# Patient Record
Sex: Female | Born: 1995 | Race: White | Hispanic: No | Marital: Single | Smoking: Never smoker
Health system: Southern US, Community
[De-identification: ages and names within clinical notes are randomized; demographics above are authoritative.]

## PROBLEM LIST (undated history)

## (undated) DIAGNOSIS — D649 Anemia, unspecified: Secondary | ICD-10-CM

## (undated) DIAGNOSIS — S0990XA Unspecified injury of head, initial encounter: Secondary | ICD-10-CM

## (undated) HISTORY — DX: Unspecified injury of head, initial encounter: S09.90XA

## (undated) HISTORY — DX: Anemia, unspecified: D64.9

---
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
## 2016-05-07 ENCOUNTER — Emergency Department
Admission: EM | Admit: 2016-05-07 | Discharge: 2016-05-07 | Disposition: A | Discharge status: Home / Self Care | Payer: Managed Care, Other (non HMO) | Attending: Emergency Medicine | Admitting: Emergency Medicine

## 2016-05-07 ENCOUNTER — Emergency Department: Payer: Managed Care, Other (non HMO)

## 2016-05-07 ENCOUNTER — Encounter: Payer: Self-pay | Admitting: Emergency Medicine

## 2016-05-07 DIAGNOSIS — Y9389 Activity, other specified: Secondary | ICD-10-CM | POA: Insufficient documentation

## 2016-05-07 DIAGNOSIS — S92302A Fracture of unspecified metatarsal bone(s), left foot, initial encounter for closed fracture: Secondary | ICD-10-CM | POA: Diagnosis not present

## 2016-05-07 DIAGNOSIS — S93402A Sprain of unspecified ligament of left ankle, initial encounter: Secondary | ICD-10-CM | POA: Diagnosis not present

## 2016-05-07 DIAGNOSIS — Y92410 Unspecified street and highway as the place of occurrence of the external cause: Secondary | ICD-10-CM | POA: Insufficient documentation

## 2016-05-07 DIAGNOSIS — X501XXA Overexertion from prolonged static or awkward postures, initial encounter: Secondary | ICD-10-CM | POA: Diagnosis not present

## 2016-05-07 DIAGNOSIS — Y999 Unspecified external cause status: Secondary | ICD-10-CM | POA: Diagnosis not present

## 2016-05-07 DIAGNOSIS — S93602A Unspecified sprain of left foot, initial encounter: Secondary | ICD-10-CM

## 2016-05-07 DIAGNOSIS — S99922A Unspecified injury of left foot, initial encounter: Secondary | ICD-10-CM | POA: Diagnosis present

## 2016-05-07 NOTE — ED Triage Notes (Addendum)
Patient ambulatory to triage with steady gait, without difficulty or distress noted; pt reports on Thursday injured left foot/ankle while crossing street

## 2016-05-07 NOTE — ED Notes (Signed)

## 2016-05-07 NOTE — Discharge Instructions (Signed)
Use the crutches to ambulate without bear weight through the left foot. Keep the splint clean and dry. Rest of the foot elevated and ice the dorsal foot through the splint. Take over-the-counter antipyretics as needed for pain relief. Follow-up with Dr. Rosita KeaMenz or local orthopedic provider in [place] for further fracture management.

## 2016-05-07 NOTE — ED Notes (Signed)
Pt presents to ED with c/o LEFT ankle pain after an injury last Thursday. Pt states she was walking across the street when her ankle "just gave out on me". Pt reports LEFT ankle pain, as well as pain at the base of her big toe. Pt ambulatory to t/x room with difficulty or distress noted. CMS in affected extremity intact, cap refill <3 secs with skin WP&D.

## 2016-05-07 NOTE — ED Provider Notes (Signed)
[facility] Emergency Department Provider Note ____________________________________________  Time seen: 2205  I have reviewed the triage vital signs and the nursing notes.  HISTORY  Chief Complaint  Ankle Pain  HPI Carla Osborne is a 20 y.o. female presents to the ED for evaluation of injury to her left foot and ankle. She describes Thursday crossing the street wearing flat shoes when she inadvertently rolled her left ankle. She describes the foot "just gave out on me." She describes pain to the lateral aspect of the ankle but describes swelling, mild bruising, and point tenderness to the dorsal aspect of the foot she localizes pain both to the base of the big toe as well as to the dorsal mid foot over the metatarsals. She describes pain is increased with attempts to bear weight and walk on the left foot. She denies any other injury at this time. She does give the history of a remote ankle sprain in April that [facility]   No past medical history on file.  There are no active problems to display for this patient.  History reviewed. No pertinent surgical history.  Prior to Admission medications   Not on File    Allergies Review of patient's allergies indicates no known allergies.  No family history on file.  Social History Social History  Substance Use Topics  . Smoking status: Never Smoker  . Smokeless tobacco: Never Used  . Alcohol use No   Review of Systems  Constitutional: Negative for fever. Musculoskeletal: Negative for back pain. Left foot and ankle pain as above. Skin: Negative for rash. Neurological: Negative for headaches, focal weakness or numbness. ____________________________________________  PHYSICAL EXAM:  VITAL SIGNS: ED Triage Vitals [05/07/16 2110]  Enc Vitals Group     BP (!) 143/84     Pulse Rate 95     Resp 18     Temp 97.9 F (36.6 C)     Temp Source Oral     SpO2 99 %    Weight 145 lb (65.8 kg)     Height 5\' 3"  (1.6 m)     Head Circumference      Peak Flow      Pain Score 7     Pain Loc      Pain Edu?      Excl. in GC?    Constitutional: Alert and oriented. Well appearing and in no distress. Head: Normocephalic and atraumatic. Cardiovascular: Normal distal pulses. Respiratory: Normal respiratory effort.  Musculoskeletal: left foot and ankle without any obvious deformity, effusion, swelling, or dislocation. The patient is sent to palpation over the dorsal aspect of the midfoot. She is also mildly tender lesion over the dorsal base of the first metatarsal. Minimal tender palpation to the lateral aspect of the ankle. Negative drawer sign. No Acute tenderness is noted. Nontender with normal range of motion in all extremities.  Neurologic: Normal speech and language. No gross focal neurologic deficits are appreciated. Skin:  Skin is warm, dry and intact. No rash noted. ____________________________________________   RADIOLOGY  Left Ankle IMPRESSION: Bony density at the base of the metatarsal seen only on the lateral projection. Correlation to point tenderness is recommended. No other focal abnormality is seen.  Left Foot IMPRESSION: 1. No acute fracture or dislocation is identified. 2. Bony density at the dorsal margin of first and second metatarsal proximal interspace. If not focally tender this is likely an os intermetatarseum.  I, Razi Hickle, Charlesetta Ivory, personally  viewed and evaluated these images (plain radiographs) as part of my medical decision making, as well as reviewing the written report by the radiologist. ____________________________________________  PROCEDURES  Short leg OCL Crutches ____________________________________________  INITIAL IMPRESSION / ASSESSMENT AND PLAN / ED COURSE  Patient with a clinically significant radiologic finding on ankle film. She is point tender over the abnormal bony projection. The concern is for an  avulsion fracture to the metatarsal. She will be fitted with a posterior short leg splint and provided with crutches for nonweightbearing until she is evaluated by a podiatrist or orthopedic provider back in her home town.  Clinical Course   ____________________________________________  FINAL CLINICAL IMPRESSION(S) / ED DIAGNOSES  Final diagnoses:  Sprain of left ankle, unspecified ligament, initial encounter  Sprain of left foot, initial encounter  Closed nondisplaced fracture of metatarsal bone of left foot, unspecified metatarsal, initial encounter      Lissa HoardJenise V Bacon Alyza Artiaga, PA-C 05/07/16 [id number]    Phineas SemenGraydon Goodman, MD 05/09/16 [id number]

## 2016-07-28 DIAGNOSIS — S0990XA Unspecified injury of head, initial encounter: Secondary | ICD-10-CM

## 2016-07-28 HISTORY — DX: Unspecified injury of head, initial encounter: S09.90XA

## 2017-04-19 ENCOUNTER — Encounter: Payer: Self-pay | Admitting: Emergency Medicine

## 2017-04-19 ENCOUNTER — Emergency Department: Payer: 59

## 2017-04-19 ENCOUNTER — Emergency Department
Admission: EM | Admit: 2017-04-19 | Discharge: 2017-04-19 | Disposition: A | Discharge status: Home / Self Care | Payer: 59 | Attending: Emergency Medicine | Admitting: Emergency Medicine

## 2017-04-19 DIAGNOSIS — M79671 Pain in right foot: Secondary | ICD-10-CM | POA: Diagnosis present

## 2017-04-19 DIAGNOSIS — M7751 Other enthesopathy of right foot: Secondary | ICD-10-CM

## 2017-04-19 DIAGNOSIS — M65271 Calcific tendinitis, right ankle and foot: Secondary | ICD-10-CM | POA: Diagnosis not present

## 2017-04-19 MED ORDER — MELOXICAM 15 MG PO TABS: 15.0000 mg | ORAL_TABLET | Freq: Every day | ORAL | 0 refills | Status: DC

## 2017-04-19 NOTE — ED Triage Notes (Addendum)
Pt c/o right foot pain/swelling for a week almost. Worse when ambulating and driving.  Ambulated to triage with steady gait. NAD. VSS. No known injury

## 2017-04-19 NOTE — ED Notes (Signed)
Pt c/o swelling to right foot for the past week. Pt states she has not taken any medications to relieve the pain.  Denies any injury, states she has sprained both feet in the past.  Pain is 7/10.

## 2017-04-19 NOTE — ED Provider Notes (Signed)
[facility] Emergency Department Provider Note  ____________________________________________  Time seen: Approximately 3:53 PM  I have reviewed the triage vital signs and the nursing notes.   HISTORY  Chief Complaint Foot Pain    HPI Carla Osborne is a 21 y.o. female who presents emergency department complaining of right foot pain. Patient reports that she has had intermittent pain 1 week. No injury. Patient reports subjective swelling. No medications prior to arrival. Patient reports the pain is on the dorsal aspect of the right foot. No other injury or complaint.   History reviewed. No pertinent past medical history.  There are no active problems to display for this patient.   History reviewed. No pertinent surgical history.  Prior to Admission medications   Medication Sig Start Date End Date Taking? Authorizing Provider  meloxicam (MOBIC) 15 MG tablet Take 1 tablet (15 mg total) by mouth daily. 04/19/17   Cuthriell, Delorise Royals, PA-C    Allergies Patient has no known allergies.  History reviewed. No pertinent family history.  Social History Social History  Substance Use Topics  . Smoking status: Never Smoker  . Smokeless tobacco: Never Used  . Alcohol use No     Review of Systems  Constitutional: No fever/chills Cardiovascular: no chest pain. Respiratory: no cough. No SOB. Musculoskeletal: positive for right foot pain Skin: Negative for rash, abrasions, lacerations, ecchymosis. Neurological: Negative for headaches, focal weakness or numbness. 10-point ROS otherwise negative.  ____________________________________________   PHYSICAL EXAM:  VITAL SIGNS: ED Triage Vitals  Enc Vitals Group     BP 04/19/17 1447 (!) 146/95     Pulse Rate 04/19/17 1446 100     Resp 04/19/17 1446 18     Temp 04/19/17 1446 (!) 97.5 F (36.4 C)     Temp Source 04/19/17 1446 Oral     SpO2 04/19/17 1446 99 %     Weight 04/19/17 1446 160 lb (72.6 kg)     Height 04/19/17 1446  (1.626 m)     Head Circumference --      Peak Flow --      Pain Score 04/19/17 1446 7     Pain Loc --      Pain Edu? --      Excl. in GC? --      Constitutional: Alert and oriented. Well appearing and in no acute distress. Eyes: Conjunctivae are normal. PERRL. EOMI. Head: Atraumatic. Neck: No stridor.    Cardiovascular: Normal rate, regular rhythm. Normal S1 and S2.  Good peripheral circulation. Respiratory: Normal respiratory effort without tachypnea or retractions. Lungs CTAB. Good air entry to the bases with no decreased or absent breath sounds. Musculoskeletal: Full range of motion to all extremities. No gross deformities appreciated.no deformities, edema, erythema noted to the right foot. Full range of motion to the ankle and all digit foot. Patient is mildly tender to palpation along the talonavicular joint line with no palpable abnormality.no other tenderness to palpation. Sensation intact to alll 5 digits. Cap refill intact all digits Neurologic:  Normal speech and language. No gross focal neurologic deficits are appreciated.  Skin:  Skin is warm, dry and intact. No rash noted. Psychiatric: Mood and affect are normal. Speech and behavior are normal. Patient exhibits appropriate insight and judgement.   ____________________________________________   LABS (all labs ordered are listed, but only abnormal results are displayed)  Labs Reviewed - No data to display ____________________________________________  EKG   ____________________________________________  RADIOLOGY Festus Barren Cuthriell, personally viewed and  evaluated these images (plain radiographs) as part of my medical decision making, as well as reviewing the written report by the radiologist.  Dg Foot Complete Right  Result Date: 04/19/2017 CLINICAL DATA:  Right foot swelling 1 week. EXAM: RIGHT FOOT COMPLETE - 3+ VIEW COMPARISON:  None. FINDINGS: There is no evidence of fracture or  dislocation. There is no evidence of arthropathy or other focal bone abnormality. Soft tissues are unremarkable. IMPRESSION: Negative. Electronically Signed   By: Elberta Fortis M.D.   On: 04/19/2017 15:37    ____________________________________________    PROCEDURES  Procedure(s) performed:    Procedures    Medications - No data to display   ____________________________________________   INITIAL IMPRESSION / ASSESSMENT AND PLAN / ED COURSE  Pertinent labs & imaging results that were available during my care of the patient were reviewed by me and considered in my medical decision making (see chart for details).  Review of the [place] CSRS was performed in accordance of the [facility] prior to dispensing any controlled drugs.     Patient's diagnosis is consistent with tendinitis of the right foot.X-ray reveals no acute osseous abnormality. Exam is reassuring.. Patient will be discharged home with prescriptions for anti-inflammatories. Patient is to follow up with primary care as needed or otherwise directed. Patient is given ED precautions to return to the ED for any worsening or new symptoms.     ____________________________________________  FINAL CLINICAL IMPRESSION(S) / ED DIAGNOSES  Final diagnoses:  Tendinitis of right foot      NEW MEDICATIONS STARTED DURING THIS VISIT:  New Prescriptions   MELOXICAM (MOBIC) 15 MG TABLET    Take 1 tablet (15 mg total) by mouth daily.        This chart was dictated using voice recognition software/Dragon. Despite best efforts to proofread, errors can occur which can change the meaning. Any change was purely unintentional.    Racheal Patches, PA-C 04/19/17 1610    Merrily Brittle, MD 04/19/17 [phone]

## 2017-09-18 ENCOUNTER — Encounter: Payer: Self-pay | Admitting: Oncology

## 2017-09-18 ENCOUNTER — Other Ambulatory Visit: Payer: Self-pay

## 2017-09-18 ENCOUNTER — Inpatient Hospital Stay: Payer: 59

## 2017-09-18 ENCOUNTER — Inpatient Hospital Stay: Payer: 59 | Attending: Oncology | Admitting: Oncology

## 2017-09-18 VITALS — BP 131/93 | HR 105 | Temp 97.1°F | Resp 12 | Ht 64.0 in | Wt 175.9 lb

## 2017-09-18 DIAGNOSIS — Z862 Personal history of diseases of the blood and blood-forming organs and certain disorders involving the immune mechanism: Secondary | ICD-10-CM

## 2017-09-18 DIAGNOSIS — D509 Iron deficiency anemia, unspecified: Secondary | ICD-10-CM | POA: Diagnosis not present

## 2017-09-18 DIAGNOSIS — G43909 Migraine, unspecified, not intractable, without status migrainosus: Secondary | ICD-10-CM | POA: Insufficient documentation

## 2017-09-18 DIAGNOSIS — Z79899 Other long term (current) drug therapy: Secondary | ICD-10-CM | POA: Insufficient documentation

## 2017-09-18 DIAGNOSIS — E538 Deficiency of other specified B group vitamins: Secondary | ICD-10-CM | POA: Diagnosis not present

## 2017-09-18 LAB — IRON AND TIBC
Iron: 131 ug/dL (ref 28–170)
Saturation Ratios: 29 % (ref 10.4–31.8)
TIBC: 448 ug/dL (ref 250–450)
UIBC: 317 ug/dL

## 2017-09-18 LAB — CBC WITH DIFFERENTIAL/PLATELET
Basophils Absolute: 0.1 10*3/uL (ref 0–0.1)
Basophils Relative: 1 %
EOS PCT: 2 %
Eosinophils Absolute: 0.1 10*3/uL (ref 0–0.7)
HEMATOCRIT: 43.7 % (ref 35.0–47.0)
Hemoglobin: 15.3 g/dL (ref 12.0–16.0)
LYMPHS ABS: 2.7 10*3/uL (ref 1.0–3.6)
LYMPHS PCT: 34 %
MCH: 34.5 pg — ABNORMAL HIGH (ref 26.0–34.0)
MCHC: 35 g/dL (ref 32.0–36.0)
MCV: 98.5 fL (ref 80.0–100.0)
MONO ABS: 0.6 10*3/uL (ref 0.2–0.9)
Monocytes Relative: 7 %
Neutro Abs: 4.4 10*3/uL (ref 1.4–6.5)
Neutrophils Relative %: 56 %
PLATELETS: 292 10*3/uL (ref 150–440)
RBC: 4.44 MIL/uL (ref 3.80–5.20)
RDW: 12.2 % (ref 11.5–14.5)
WBC: 7.9 10*3/uL (ref 3.6–11.0)

## 2017-09-18 LAB — COMPREHENSIVE METABOLIC PANEL
ALBUMIN: 4.3 g/dL (ref 3.5–5.0)
ALT: 31 U/L (ref 14–54)
AST: 29 U/L (ref 15–41)
Alkaline Phosphatase: 72 U/L (ref 38–126)
Anion gap: 14 (ref 5–15)
BUN: 14 mg/dL (ref 6–20)
CHLORIDE: 101 mmol/L (ref 101–111)
CO2: 20 mmol/L — ABNORMAL LOW (ref 22–32)
Calcium: 9.6 mg/dL (ref 8.9–10.3)
Creatinine, Ser: 0.67 mg/dL (ref 0.44–1.00)
GFR calc Af Amer: >60 mL/min (ref >60)
GFR calc non Af Amer: >60 mL/min (ref >60)
GLUCOSE: 82 mg/dL (ref 65–99)
POTASSIUM: 3.9 mmol/L (ref 3.5–5.1)
Sodium: 135 mmol/L (ref 135–145)
Total Bilirubin: 0.5 mg/dL (ref 0.3–1.2)
Total Protein: 7.7 g/dL (ref 6.5–8.1)

## 2017-09-18 LAB — FERRITIN: Ferritin: 192 ng/mL (ref 11–307)

## 2017-09-18 LAB — RETICULOCYTES
RBC.: 4.53 MIL/uL (ref 3.80–5.20)
Retic Count, Absolute: 54.4 10*3/uL (ref 19.0–183.0)
Retic Ct Pct: 1.2 % (ref 0.4–3.1)

## 2017-09-18 LAB — FOLATE: FOLATE: 16.3 ng/mL (ref >5.9)

## 2017-09-18 LAB — TSH: TSH: 0.768 u[IU]/mL (ref 0.350–4.500)

## 2017-09-18 LAB — VITAMIN B12: VITAMIN B 12: 572 pg/mL (ref 180–914)

## 2017-09-18 NOTE — Progress Notes (Signed)
Patient here for initial work up, she reports occasional leg cramping. She has a history of brain injury.

## 2017-09-18 NOTE — Progress Notes (Signed)
Hematology/Oncology Consult note [facility] [phone] [phone]  Patient Care Team: System, Pcp Not In as PCP - General   Name of the patient: Carla Osborne  [id number]  12-18-1995    Reason for referral- Anemia   Referring physician- Dr. Elberta Spaniel  Date of visit: 09/18/17   History of presenting illness-patient is a 22 year old female who has evaluation and management of iron deficiency anemia.  Last iron studies from June 2018 revealed a ferritin of 152.  Iron saturation was normal at 35 and total serum iron was normal at 134.  I do not have any values of hemoglobin available at this time  Patient is a Consulting civil engineer at [employer] and has seen Dr. Elberta Spaniel at [facility]  in the past and has received IV iron with him back in June 2018.  She reports having being diagnosed with iron deficiency anemia a year ago.  She has received 15 when of her infusions over the last 1 year intermittently.  She denies any consistent use of NSAIDs.  She uses them about 2-3 times a month during her menstrual cycles.  Denies any bleeding in her stool or dark tarry stools.  Denies any family history of colon cancer.  She does have history of menorrhagia but is currently on oral contraceptive pills and her periods last for about 7 days.  She does not think that they are particularly heavy and they are improved since she has been on birth control since the age of 36 or 75.  Denies any symptoms of gluten intolerance.  Reports that her appetite is good and she has not had any unintentional weight loss.  She gets occasional migraine headaches.  She is currently on iron tablet once a day and has been on it for a long time.  She also has history of B12 deficiency and was taking B12 supplements but has not done so over the last 1 month or so  ECOG PS- 0  Pain scale- 4 headaches   Review of systems- Review of Systems  Constitutional: Negative for chills, fever, malaise/fatigue and weight loss.    HENT: Negative for congestion, ear discharge and nosebleeds.   Eyes: Negative for blurred vision.  Respiratory: Negative for cough, hemoptysis, sputum production, shortness of breath and wheezing.   Cardiovascular: Negative for chest pain, palpitations, orthopnea and claudication.  Gastrointestinal: Negative for abdominal pain, blood in stool, constipation, diarrhea, heartburn, melena, nausea and vomiting.  Genitourinary: Negative for dysuria, flank pain, frequency, hematuria and urgency.  Musculoskeletal: Negative for back pain, joint pain and myalgias.  Skin: Negative for rash.  Neurological: Negative for dizziness, tingling, focal weakness, seizures, weakness and headaches.  Endo/Heme/Allergies: Does not bruise/bleed easily.  Psychiatric/Behavioral: Negative for depression and suicidal ideas. The patient does not have insomnia.     No Known Allergies  There are no active problems to display for this patient.    Past Medical History:  Diagnosis Date  . Anemia   . Head injury 2018     History reviewed. No pertinent surgical history.  Social History   Socioeconomic History  . Marital status: Single    Spouse name: Not on file  . Number of children: Not on file  . Years of education: Not on file  . Highest education level: Not on file  Social Needs  . Financial resource strain: Not on file  . Food insecurity - worry: Not on file  . Food insecurity - inability: Not on file  . Transportation needs -  medical: Not on file  . Transportation needs - non-medical: Not on file  Occupational History  . Not on file  Tobacco Use  . Smoking status: Never Smoker  . Smokeless tobacco: Never Used  Substance and Sexual Activity  . Alcohol use: No  . Drug use: No  . Sexual activity: No  Other Topics Concern  . Not on file  Social History Narrative  . Not on file     Family History  Problem Relation Age of Onset  . Hypertension Mother   . Cancer Maternal Grandmother   .  Cancer Maternal Grandfather        leukemia  . Heart disease Maternal Grandfather   . Hypertension Maternal Grandfather   . Cancer Paternal Grandmother        melamoma  . Diabetes Paternal Grandmother   . Hypertension Paternal Grandmother   . Cancer Paternal Grandfather        lung  . Diabetes Paternal Grandfather      Current Outpatient Medications:  .  Cyanocobalamin (VITAMIN B 12) 100 MCG LOZG, Take 4 tablets by mouth daily., Disp: , Rfl:  .  drospirenone-ethinyl estradiol (YASMIN,ZARAH,SYEDA) 3-0.03 MG tablet, Take 1 tablet by mouth daily., Disp: , Rfl:  .  ferrous sulfate 324 (65 Fe) MG TBEC, Take 1 tablet by mouth daily., Disp: , Rfl:  .  Icosapent Ethyl (VASCEPA) 1 g CAPS, Take 1 capsule by mouth daily., Disp: , Rfl:  .  Multiple Vitamin (MULTI-VITAMINS) TABS, Take 1 tablet by mouth daily., Disp: , Rfl:  .  zonisamide (ZONEGRAN) 25 MG capsule, Take 75 mg by mouth daily., Disp: , Rfl: 1   Physical exam:  Vitals:   09/18/17 1347 09/18/17 1408 09/18/17 1411  BP:  (!) 131/93   Pulse:  93 (!) 105  Resp: 12    Temp:  (!) 97.1 F (36.2 C)   TempSrc:  Tympanic Tympanic  Weight: 175 lb 14.4 oz (79.8 kg)    Height: 5\' 4"  (1.626 m)     Physical Exam  Constitutional: She is oriented to person, place, and time and well-developed, well-nourished, and in no distress.  HENT:  Head: Normocephalic and atraumatic.  Eyes: EOM are normal. Pupils are equal, round, and reactive to light.  Neck: Normal range of motion.  Cardiovascular: Normal rate, regular rhythm and normal heart sounds.  Pulmonary/Chest: Effort normal and breath sounds normal.  Abdominal: Soft. Bowel sounds are normal.  Neurological: She is alert and oriented to person, place, and time.  Skin: Skin is warm and dry.      Assessment and plan- Patient is a 22 y.o. female with prior history of iron deficiency anemia has been referred to [place] for further evaluation  Today I only have prior iron studies from May 2018 which  were essentially normal.  No prior CBCs available for comparison.  Notes mention iron deficiency anemia.  She is currently on oral iron.  Today I will check CBC, CMP, ferritin and iron studies, B12 and folate, haptoglobin, reticulocyte count, celiac disease panel and urinalysis.  If she has evidence of significant iron deficiency anemia I will discuss GI evaluation during her next visit.  Poor history she does not seem to have significant menorrhagia that could be contributing to her iron deficiency anemia  If her iron studies reveal evidence of iron deficiency, I will consider giving her IV iron.  I would recommend doing 2 doses of Feraheme 510 mg IV weekly.  Discussed risks and benefits of IV  iron including all but not limited to headaches, leg swelling and possible risk of infusion reactions.  Patient understands and agrees to proceed as planned  I will see her back in 2 months time with repeat CBC ferritin and iron studies   Thank you for this kind referral and the opportunity to participate in the care of this patient   Visit Diagnosis 1. History of iron deficiency anemia     Dr. Owens SharkArchana Keisa Blow, MD, MPH [facility] Pager- [phone] 09/18/2017 3:09 PM

## 2017-09-19 LAB — HAPTOGLOBIN: Haptoglobin: 72 mg/dL (ref 34–200)

## 2017-09-24 ENCOUNTER — Other Ambulatory Visit: Payer: Self-pay | Admitting: *Deleted

## 2017-09-25 ENCOUNTER — Inpatient Hospital Stay: Payer: 59

## 2017-09-25 ENCOUNTER — Inpatient Hospital Stay: Payer: 59 | Attending: Oncology

## 2017-10-09 ENCOUNTER — Ambulatory Visit: Payer: Self-pay

## 2017-10-29 ENCOUNTER — Telehealth: Payer: Self-pay | Admitting: Oncology

## 2017-10-29 NOTE — Telephone Encounter (Signed)
[facility] Lab/MD appts, per MD on PAL. L/M on v/m. Also mailed updated appt ltr. MF

## 2017-11-12 ENCOUNTER — Other Ambulatory Visit: Payer: Self-pay

## 2017-11-12 ENCOUNTER — Ambulatory Visit: Payer: Self-pay | Admitting: Oncology

## 2017-11-13 ENCOUNTER — Ambulatory Visit: Payer: Self-pay | Admitting: Oncology

## 2017-11-13 ENCOUNTER — Other Ambulatory Visit: Payer: Self-pay

## 2017-11-19 ENCOUNTER — Inpatient Hospital Stay (HOSPITAL_BASED_OUTPATIENT_CLINIC_OR_DEPARTMENT_OTHER): Payer: 59 | Admitting: Oncology

## 2017-11-19 ENCOUNTER — Encounter: Payer: Self-pay | Admitting: Oncology

## 2017-11-19 ENCOUNTER — Inpatient Hospital Stay: Payer: 59 | Attending: Oncology

## 2017-11-19 ENCOUNTER — Other Ambulatory Visit: Payer: Self-pay

## 2017-11-19 VITALS — BP 129/85 | HR 95 | Temp 97.3°F | Resp 16 | Ht 64.0 in | Wt 175.5 lb

## 2017-11-19 DIAGNOSIS — E538 Deficiency of other specified B group vitamins: Secondary | ICD-10-CM | POA: Insufficient documentation

## 2017-11-19 DIAGNOSIS — N920 Excessive and frequent menstruation with regular cycle: Secondary | ICD-10-CM | POA: Insufficient documentation

## 2017-11-19 DIAGNOSIS — J039 Acute tonsillitis, unspecified: Secondary | ICD-10-CM | POA: Diagnosis not present

## 2017-11-19 DIAGNOSIS — Z79899 Other long term (current) drug therapy: Secondary | ICD-10-CM | POA: Insufficient documentation

## 2017-11-19 DIAGNOSIS — G43909 Migraine, unspecified, not intractable, without status migrainosus: Secondary | ICD-10-CM | POA: Insufficient documentation

## 2017-11-19 DIAGNOSIS — D509 Iron deficiency anemia, unspecified: Secondary | ICD-10-CM | POA: Diagnosis present

## 2017-11-19 DIAGNOSIS — Z862 Personal history of diseases of the blood and blood-forming organs and certain disorders involving the immune mechanism: Secondary | ICD-10-CM

## 2017-11-19 LAB — CBC
HEMATOCRIT: 40.6 % (ref 35.0–47.0)
Hemoglobin: 14.4 g/dL (ref 12.0–16.0)
MCH: 34.5 pg — AB (ref 26.0–34.0)
MCHC: 35.5 g/dL (ref 32.0–36.0)
MCV: 97.3 fL (ref 80.0–100.0)
Platelets: 282 10*3/uL (ref 150–440)
RBC: 4.18 MIL/uL (ref 3.80–5.20)
RDW: 12.3 % (ref 11.5–14.5)
WBC: 7.5 10*3/uL (ref 3.6–11.0)

## 2017-11-19 LAB — IRON AND TIBC
Iron: 129 ug/dL (ref 28–170)
Saturation Ratios: 34 % — ABNORMAL HIGH (ref 10.4–31.8)
TIBC: 378 ug/dL (ref 250–450)
UIBC: 249 ug/dL

## 2017-11-19 LAB — FERRITIN: Ferritin: 136 ng/mL (ref 11–307)

## 2017-11-19 MED ORDER — AMOXICILLIN-POT CLAVULANATE 875-125 MG PO TABS: 1.0000 | ORAL_TABLET | Freq: Two times a day (BID) | ORAL | 0 refills | Status: AC

## 2017-11-19 NOTE — Progress Notes (Signed)
Hematology/Oncology Consult note [facility]  [phone] [phone]  Patient Care Team: System, Pcp Not In as PCP - General   Name of the patient: Carla Osborne  [id number]  04-14-1996   Date of visit: 11/19/17  Diagnosis- h/o iron deficiency anemia  Chief complaint/ Reason for visit- routine f/u for h/o anemia in the past  Heme/Onc history:  patient is a 22 year old female who has evaluation and management of iron deficiency anemia.  Last iron studies from June 2018 revealed a ferritin of 152.  Iron saturation was normal at 35 and total serum iron was normal at 134.  I do not have any values of hemoglobin available at this time  Patient is a Consulting civil engineer at [employer] and has seen Dr. Elberta Spaniel at [facility]  in the past and has received IV iron with him back in June 2018.  She reports having being diagnosed with iron deficiency anemia a year ago.  She has received 15 when of her infusions over the last 1 year intermittently.  She denies any consistent use of NSAIDs.  She uses them about 2-3 times a month during her menstrual cycles.  Denies any bleeding in her stool or dark tarry stools.  Denies any family history of colon cancer.  She does have history of menorrhagia but is currently on oral contraceptive pills and her periods last for about 7 days.  She does not think that they are particularly heavy and they are improved since she has been on birth control since the age of 9 or 70.  Denies any symptoms of gluten intolerance.  Reports that her appetite is good and she has not had any unintentional weight loss.  She gets occasional migraine headaches.  She is currently on iron tablet once a day and has been on it for a long time.  She also has history of B12 deficiency and was taking B12 supplements but has not done so over the last 1 month or so  Labs from feb 2019 showed no evidence of anemia. Iron and b12 were normal   Interval history- reports pain in the  right side of the neck below her ear. She has tried OTC medications for sinus pain and that has not helped. Denies other complaints  ECOG PS- 0 Pain scale- 0   Review of systems- Review of Systems  Constitutional: Negative for chills, fever, malaise/fatigue and weight loss.  HENT: Positive for congestion. Negative for ear discharge and nosebleeds.        Right sided neck pain  Eyes: Negative for blurred vision.  Respiratory: Negative for cough, hemoptysis, sputum production, shortness of breath and wheezing.   Cardiovascular: Negative for chest pain, palpitations, orthopnea and claudication.  Gastrointestinal: Negative for abdominal pain, blood in stool, constipation, diarrhea, heartburn, melena, nausea and vomiting.  Genitourinary: Negative for dysuria, flank pain, frequency, hematuria and urgency.  Musculoskeletal: Negative for back pain, joint pain and myalgias.  Skin: Negative for rash.  Neurological: Negative for dizziness, tingling, focal weakness, seizures, weakness and headaches.  Endo/Heme/Allergies: Does not bruise/bleed easily.  Psychiatric/Behavioral: Negative for depression and suicidal ideas. The patient does not have insomnia.       No Known Allergies   Past Medical History:  Diagnosis Date  . Anemia   . Head injury 2018     History reviewed. No pertinent surgical history.  Social History   Socioeconomic History  . Marital status: Single    Spouse name: Not on file  .  Number of children: Not on file  . Years of education: Not on file  . Highest education level: Not on file  Occupational History  . Not on file  Social Needs  . Financial resource strain: Not on file  . Food insecurity:    Worry: Not on file    Inability: Not on file  . Transportation needs:    Medical: Not on file    Non-medical: Not on file  Tobacco Use  . Smoking status: Never Smoker  . Smokeless tobacco: Never Used  Substance and Sexual Activity  . Alcohol use: No  . Drug use:  No  . Sexual activity: Never  Lifestyle  . Physical activity:    Days per week: Not on file    Minutes per session: Not on file  . Stress: Not on file  Relationships  . Social connections:    Talks on phone: Not on file    Gets together: Not on file    Attends religious service: Not on file    Active member of club or organization: Not on file    Attends meetings of clubs or organizations: Not on file    Relationship status: Not on file  . Intimate partner violence:    Fear of current or ex partner: Not on file    Emotionally abused: Not on file    Physically abused: Not on file    Forced sexual activity: Not on file  Other Topics Concern  . Not on file  Social History Narrative  . Not on file    Family History  Problem Relation Age of Onset  . Hypertension Mother   . Cancer Maternal Grandmother   . Cancer Maternal Grandfather        leukemia  . Heart disease Maternal Grandfather   . Hypertension Maternal Grandfather   . Cancer Paternal Grandmother        melamoma  . Diabetes Paternal Grandmother   . Hypertension Paternal Grandmother   . Cancer Paternal Grandfather        lung  . Diabetes Paternal Grandfather      Current Outpatient Medications:  .  Cyanocobalamin (VITAMIN B 12) 100 MCG LOZG, Take 4 tablets by mouth daily., Disp: , Rfl:  .  drospirenone-ethinyl estradiol (YASMIN,ZARAH,SYEDA) 3-0.03 MG tablet, Take 1 tablet by mouth daily., Disp: , Rfl:  .  ferrous sulfate 324 (65 Fe) MG TBEC, Take 1 tablet by mouth daily., Disp: , Rfl:  .  Multiple Vitamin (MULTI-VITAMINS) TABS, Take 1 tablet by mouth daily., Disp: , Rfl:  .  zonisamide (ZONEGRAN) 25 MG capsule, Take 75 mg by mouth daily., Disp: , Rfl: 1 .  amoxicillin-clavulanate (AUGMENTIN) 875-125 MG tablet, Take 1 tablet by mouth 2 (two) times daily for 5 days., Disp: 10 tablet, Rfl: 0 .  Icosapent Ethyl (VASCEPA) 1 g CAPS, Take 1 capsule by mouth daily., Disp: , Rfl:   Physical exam:  Vitals:   11/19/17  1026  BP: 129/85  Pulse: 95  Resp: 16  Temp: (!) 97.3 F (36.3 C)  TempSrc: Tympanic  SpO2: 97%  Weight: 175 lb 8 oz (79.6 kg)  Height: 5\' 4"  (1.626 m)   Physical Exam  Constitutional: She is oriented to person, place, and time.  HENT:  Head: Normocephalic and atraumatic.  Mouth/Throat: Oropharynx is clear and moist.  Right tonsil appears erythematous and mildly inflammed and enlarged. No pustules  Eyes: Pupils are equal, round, and reactive to light. EOM are normal.  Neck: Normal  range of motion.  Cardiovascular: Normal rate, regular rhythm and normal heart sounds.  Pulmonary/Chest: Effort normal and breath sounds normal.  Abdominal: Soft. Bowel sounds are normal.  Neurological: She is alert and oriented to person, place, and time.  Skin: Skin is warm and dry.     CMP Latest Ref Rng & Units 09/18/2017  Glucose 65 - 99 mg/dL 82  BUN 6 - 20 mg/dL 14  Creatinine 1.61 - 0.96 mg/dL 0.45  Sodium 409 - 811 mmol/L 135  Potassium 3.5 - 5.1 mmol/L 3.9  Chloride 101 - 111 mmol/L 101  CO2 22 - 32 mmol/L 20(L)  Calcium 8.9 - 10.3 mg/dL 9.6  Total Protein 6.5 - 8.1 g/dL 7.7  Total Bilirubin 0.3 - 1.2 mg/dL 0.5  Alkaline Phos 38 - 126 U/L 72  AST 15 - 41 U/L 29  ALT 14 - 54 U/L 31   CBC Latest Ref Rng & Units 11/19/2017  WBC 3.6 - 11.0 K/uL 7.5  Hemoglobin 12.0 - 16.0 g/dL 91.4  Hematocrit 78.2 - 47.0 % 40.6  Platelets 150 - 440 K/uL 282     Assessment and plan- Patient is a 22 y.o. female with following issues:  1 h/o iron deficiency anemia: no current evidence of iron deficiency or anemia. Repeat cbc ferritin and iron studies in august when she gets back from [place] and I will see her in November with same set of labs  2. Possible tonsillitis acute: I have prescribed 5 days of augmentin since she does not have PCP here. If it does not get better, she needs to touch base with her pcp or ENT when she returns to [place] in may 2019   Visit Diagnosis 1. Iron deficiency anemia,  unspecified iron deficiency anemia type   2. Acute erythematous tonsillitis      Dr. Owens Shark, MD, MPH [facility] at [facility] [phone] 11/19/2017 3:31 PM

## 2018-03-19 ENCOUNTER — Inpatient Hospital Stay: Payer: 59

## 2018-06-18 ENCOUNTER — Other Ambulatory Visit: Payer: Self-pay

## 2018-06-18 ENCOUNTER — Ambulatory Visit: Payer: Self-pay | Admitting: Oncology

## 2019-03-19 IMAGING — DX DG FOOT COMPLETE 3+V*R*
3 series · 3 of 3 positions shown · non-contrast
Comparison: None.

CLINICAL DATA: Right foot swelling 1 week.

EXAM:
RIGHT FOOT COMPLETE - 3+ VIEW

[foot ap]
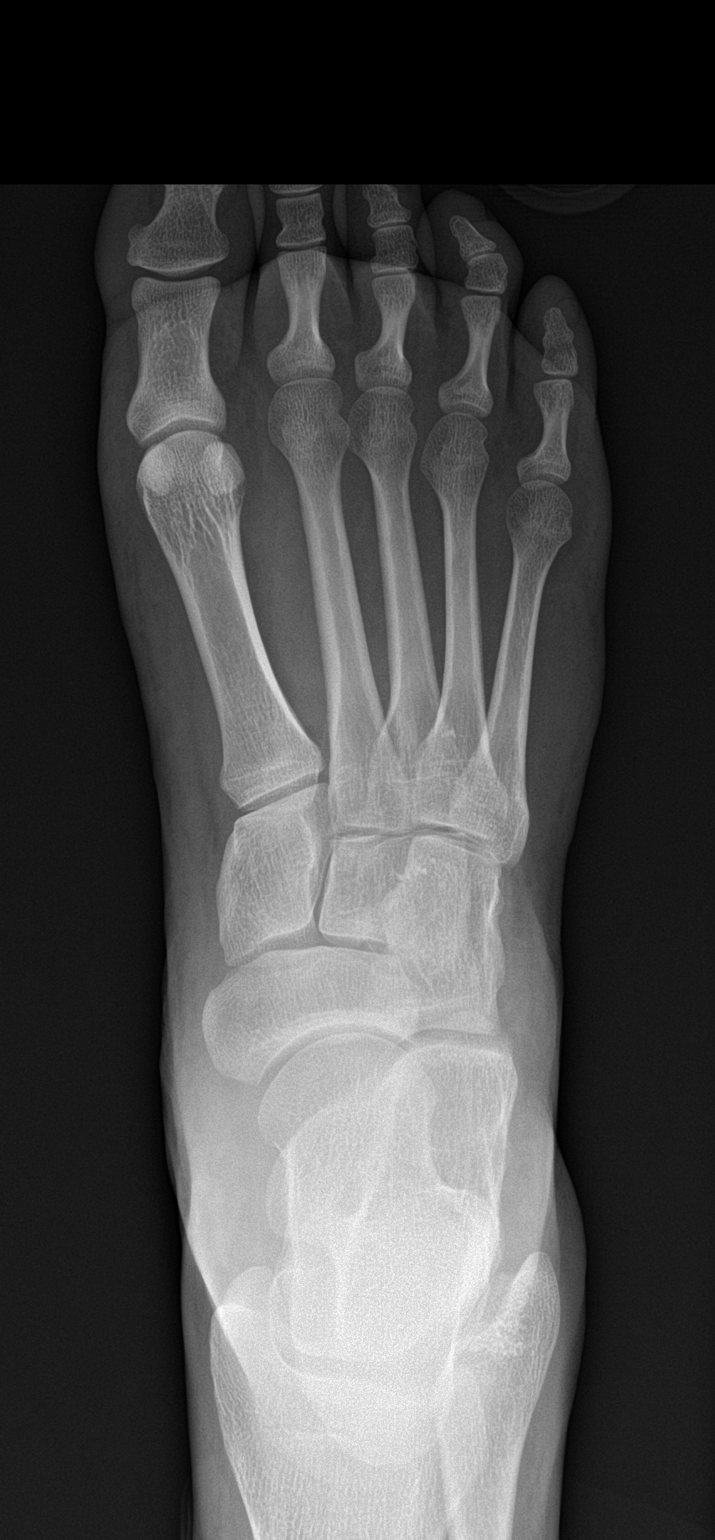

[foot obl]
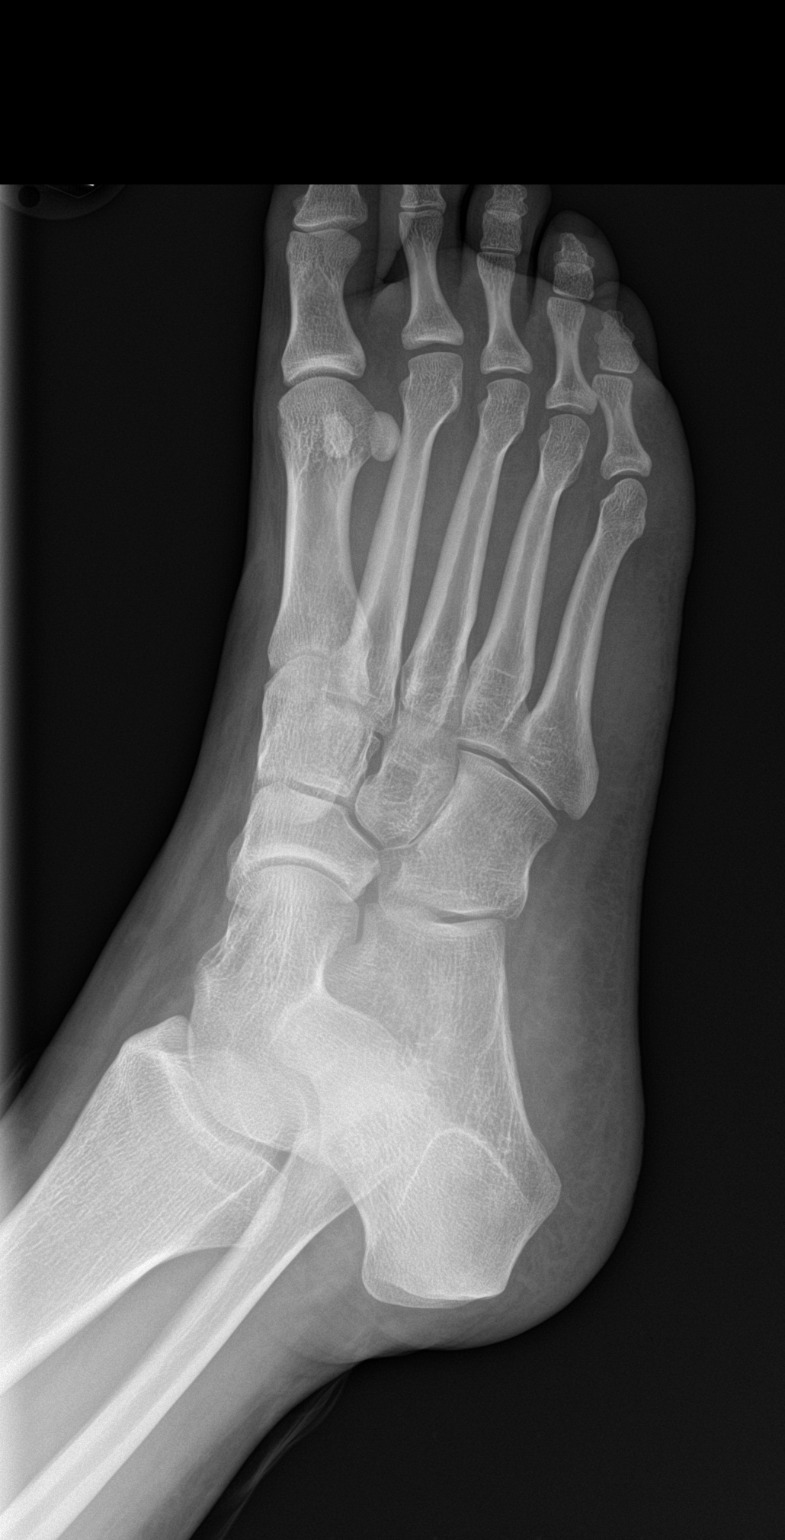

[foot lat]
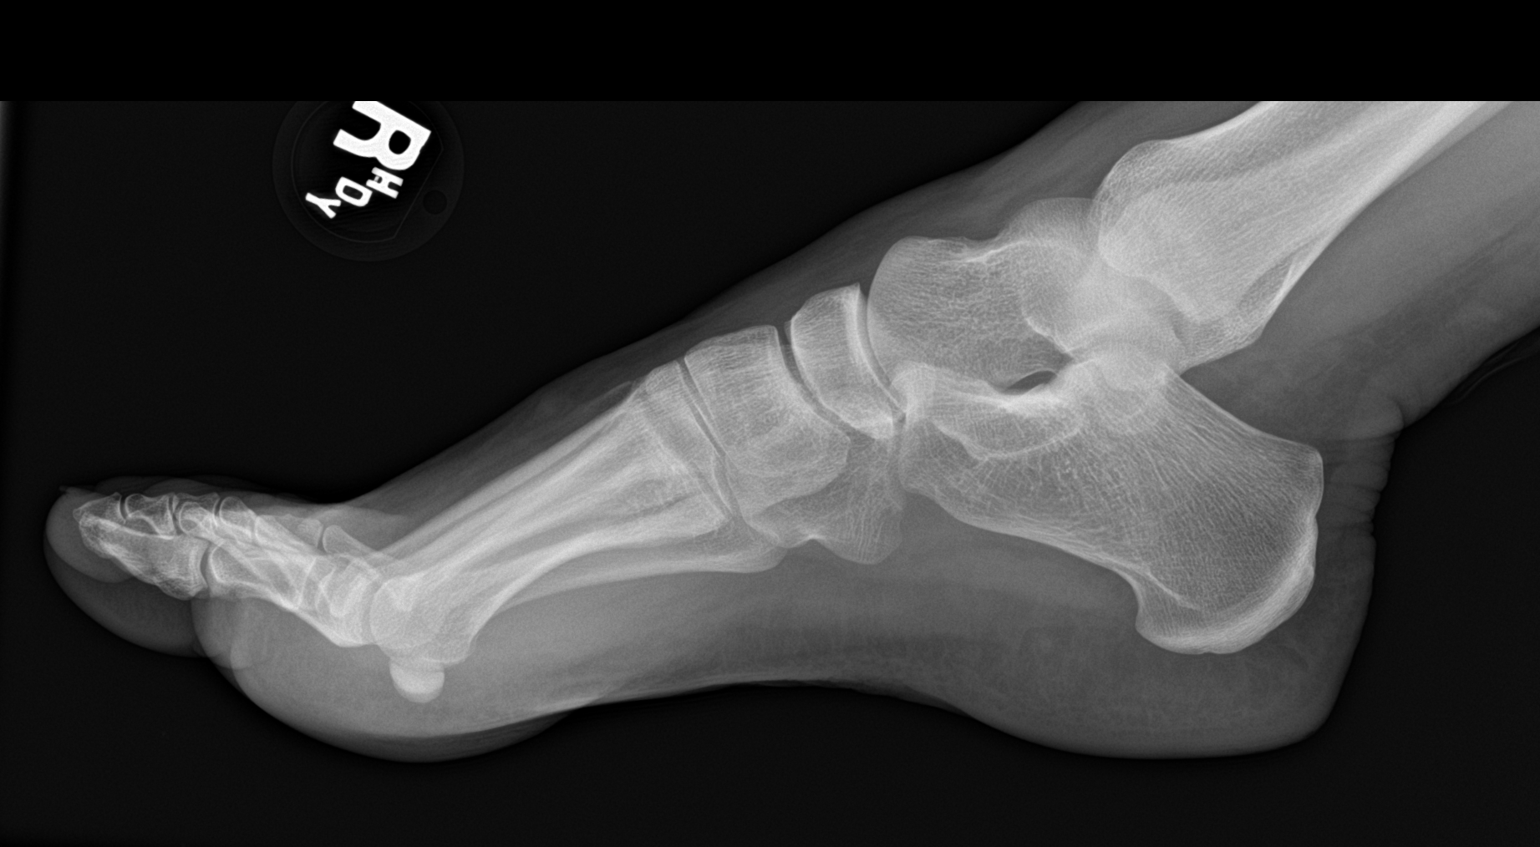

[3 of 3 positions shown; findings below may reference images not displayed]

FINDINGS: There is no evidence of fracture or dislocation. There is no
evidence of arthropathy or other focal bone abnormality. Soft
tissues are unremarkable.
IMPRESSION: Negative.
